# Patient Record
Sex: Female | Born: 1975 | Race: Black or African American | Hispanic: No | Marital: Married | Smoking: Never smoker
Health system: Southern US, Community
[De-identification: ages and names within clinical notes are randomized; demographics above are authoritative.]

## PROBLEM LIST (undated history)

## (undated) DIAGNOSIS — Z789 Other specified health status: Secondary | ICD-10-CM

---

## 2002-04-12 HISTORY — PX: LAPAROSCOPY ABDOMEN DIAGNOSTIC: PRO50

## 2013-04-12 NOTE — L&D Delivery Note (Signed)
Delivery Note At 8:14 PM a viable and healthy female was delivered via Vaginal, Spontaneous Delivery (Presentation: Left Occiput Anterior).  APGAR: 9, 9; weight 7 lb 14.6 oz (3590 g).   Placenta status: Intact, Spontaneous.  Cord: 3 vessels with the following complications: None.    Anesthesia: Epidural  Episiotomy: None Lacerations: 2nd degree Suture Repair: 3.0 vicryl rapide Est. Blood Loss (mL): 350  Mom to postpartum.  Baby to Couplet care / Skin to Skin.  [facility] DtMUHAMMAD,WALIDAH 10/22/2013, 7:27 AM

## 2013-07-26 LAB — OB RESULTS CONSOLE HIV ANTIBODY (ROUTINE TESTING)
HIV: NONREACTIVE
HIV: NONREACTIVE

## 2013-07-26 LAB — OB RESULTS CONSOLE GC/CHLAMYDIA
Chlamydia: NEGATIVE
Chlamydia: NEGATIVE
GC PROBE AMP, GENITAL: NEGATIVE
Gonorrhea: NEGATIVE

## 2013-07-26 LAB — OB RESULTS CONSOLE HEPATITIS B SURFACE ANTIGEN
Hepatitis B Surface Ag: NEGATIVE
Hepatitis B Surface Ag: NEGATIVE

## 2013-07-26 LAB — OB RESULTS CONSOLE ANTIBODY SCREEN: ANTIBODY SCREEN: NEGATIVE

## 2013-07-26 LAB — OB RESULTS CONSOLE ABO/RH: RH Type: POSITIVE

## 2013-07-26 LAB — OB RESULTS CONSOLE RUBELLA ANTIBODY, IGM
RUBELLA: IMMUNE
Rubella: IMMUNE

## 2013-07-26 LAB — OB RESULTS CONSOLE RPR
RPR: NONREACTIVE
RPR: NONREACTIVE

## 2013-07-31 ENCOUNTER — Other Ambulatory Visit (HOSPITAL_COMMUNITY): Payer: Self-pay | Admitting: Nurse Practitioner

## 2013-07-31 DIAGNOSIS — Z3689 Encounter for other specified antenatal screening: Secondary | ICD-10-CM

## 2013-08-08 ENCOUNTER — Ambulatory Visit (HOSPITAL_COMMUNITY): Admission: RE | Admit: 2013-08-08 | Source: Ambulatory Visit

## 2013-08-13 ENCOUNTER — Encounter (HOSPITAL_COMMUNITY): Payer: Self-pay

## 2013-08-13 ENCOUNTER — Other Ambulatory Visit (HOSPITAL_COMMUNITY): Payer: Self-pay | Admitting: Nurse Practitioner

## 2013-08-13 ENCOUNTER — Ambulatory Visit (HOSPITAL_COMMUNITY)
Admission: RE | Admit: 2013-08-13 | Discharge: 2013-08-13 | Disposition: A | Discharge status: Home / Self Care | Source: Ambulatory Visit | Attending: Nurse Practitioner | Admitting: Nurse Practitioner

## 2013-08-13 DIAGNOSIS — Z3689 Encounter for other specified antenatal screening: Secondary | ICD-10-CM

## 2013-09-06 ENCOUNTER — Other Ambulatory Visit (HOSPITAL_COMMUNITY): Payer: Self-pay | Admitting: Nurse Practitioner

## 2013-09-06 DIAGNOSIS — Z0489 Encounter for examination and observation for other specified reasons: Secondary | ICD-10-CM

## 2013-09-06 DIAGNOSIS — IMO0002 Reserved for concepts with insufficient information to code with codable children: Secondary | ICD-10-CM

## 2013-09-13 ENCOUNTER — Ambulatory Visit (HOSPITAL_COMMUNITY)
Admission: RE | Admit: 2013-09-13 | Discharge: 2013-09-13 | Disposition: A | Discharge status: Home / Self Care | Source: Ambulatory Visit | Attending: Nurse Practitioner | Admitting: Nurse Practitioner

## 2013-09-13 DIAGNOSIS — Z1389 Encounter for screening for other disorder: Secondary | ICD-10-CM | POA: Diagnosis not present

## 2013-09-13 DIAGNOSIS — Z3689 Encounter for other specified antenatal screening: Secondary | ICD-10-CM | POA: Insufficient documentation

## 2013-09-13 DIAGNOSIS — Z0489 Encounter for examination and observation for other specified reasons: Secondary | ICD-10-CM

## 2013-09-13 DIAGNOSIS — IMO0002 Reserved for concepts with insufficient information to code with codable children: Secondary | ICD-10-CM

## 2013-09-13 DIAGNOSIS — Z363 Encounter for antenatal screening for malformations: Secondary | ICD-10-CM | POA: Insufficient documentation

## 2013-09-27 LAB — OB RESULTS CONSOLE GBS: GBS: NEGATIVE

## 2013-10-21 ENCOUNTER — Encounter (HOSPITAL_COMMUNITY): Payer: Self-pay | Admitting: *Deleted

## 2013-10-21 ENCOUNTER — Inpatient Hospital Stay (HOSPITAL_COMMUNITY): Admitting: Anesthesiology

## 2013-10-21 ENCOUNTER — Encounter (HOSPITAL_COMMUNITY): Payer: Self-pay | Admitting: General Practice

## 2013-10-21 ENCOUNTER — Inpatient Hospital Stay (HOSPITAL_COMMUNITY)
Admission: AD | Admit: 2013-10-21 | Discharge: 2013-10-21 | Disposition: A | Discharge status: Home / Self Care | Source: Ambulatory Visit | Attending: Obstetrics & Gynecology | Admitting: Obstetrics & Gynecology

## 2013-10-21 ENCOUNTER — Inpatient Hospital Stay (HOSPITAL_COMMUNITY)
Admission: AD | Admit: 2013-10-21 | Discharge: 2013-10-23 | DRG: 775 | Disposition: A | Discharge status: Home / Self Care | Source: Ambulatory Visit | Attending: Obstetrics & Gynecology | Admitting: Obstetrics & Gynecology

## 2013-10-21 ENCOUNTER — Encounter (HOSPITAL_COMMUNITY): Admitting: Anesthesiology

## 2013-10-21 DIAGNOSIS — O479 False labor, unspecified: Secondary | ICD-10-CM | POA: Diagnosis present

## 2013-10-21 DIAGNOSIS — IMO0001 Reserved for inherently not codable concepts without codable children: Secondary | ICD-10-CM

## 2013-10-21 LAB — CBC
HCT: 38.6 % (ref 36.0–46.0)
Hemoglobin: 13.5 g/dL (ref 12.0–15.0)
MCH: 32.8 pg (ref 26.0–34.0)
MCHC: 35 g/dL (ref 30.0–36.0)
MCV: 93.9 fL (ref 78.0–100.0)
Platelets: 177 10*3/uL (ref 150–400)
RBC: 4.11 MIL/uL (ref 3.87–5.11)
RDW: 13.2 % (ref 11.5–15.5)
WBC: 8.5 10*3/uL (ref 4.0–10.5)

## 2013-10-21 LAB — TYPE AND SCREEN
ABO/RH(D): A POS
ANTIBODY SCREEN: NEGATIVE

## 2013-10-21 LAB — ABO/RH: ABO/RH(D): A POS

## 2013-10-21 MED ORDER — CITRIC ACID-SODIUM CITRATE 334-500 MG/5ML PO SOLN: 30.0000 mL | ORAL | Status: DC | PRN

## 2013-10-21 MED ORDER — IBUPROFEN 600 MG PO TABS: 600.0000 mg | ORAL_TABLET | Freq: Four times a day (QID) | ORAL | Status: DC | PRN

## 2013-10-21 MED ORDER — PRENATAL MULTIVITAMIN CH
1.0000 | ORAL_TABLET | Freq: Every day | ORAL | Status: DC
  Administered 2013-10-22 – 2013-10-23 (×2): 1 via ORAL
  Filled 2013-10-21 (×2): qty 1

## 2013-10-21 MED ORDER — EPHEDRINE 5 MG/ML INJ
10.0000 mg | INTRAVENOUS | Status: DC | PRN
  Filled 2013-10-21: qty 2

## 2013-10-21 MED ORDER — LANOLIN HYDROUS EX OINT: TOPICAL_OINTMENT | CUTANEOUS | Status: DC | PRN

## 2013-10-21 MED ORDER — ZOLPIDEM TARTRATE 5 MG PO TABS: 5.0000 mg | ORAL_TABLET | Freq: Every evening | ORAL | Status: DC | PRN

## 2013-10-21 MED ORDER — LACTATED RINGERS IV SOLN
500.0000 mL | Freq: Once | INTRAVENOUS | Status: AC
  Administered 2013-10-21: 18:00:00 via INTRAVENOUS

## 2013-10-21 MED ORDER — FENTANYL 2.5 MCG/ML BUPIVACAINE 1/10 % EPIDURAL INFUSION (WH - ANES)
14.0000 mL/h | INTRAMUSCULAR | Status: DC | PRN
  Administered 2013-10-21: 14 mL/h via EPIDURAL
  Filled 2013-10-21: qty 125

## 2013-10-21 MED ORDER — LACTATED RINGERS IV SOLN: INTRAVENOUS | Status: DC

## 2013-10-21 MED ORDER — DIPHENHYDRAMINE HCL 25 MG PO CAPS: 25.0000 mg | ORAL_CAPSULE | Freq: Four times a day (QID) | ORAL | Status: DC | PRN

## 2013-10-21 MED ORDER — WITCH HAZEL-GLYCERIN EX PADS: 1.0000 "application " | MEDICATED_PAD | CUTANEOUS | Status: DC | PRN

## 2013-10-21 MED ORDER — FLEET ENEMA 7-19 GM/118ML RE ENEM: 1.0000 | ENEMA | RECTAL | Status: DC | PRN

## 2013-10-21 MED ORDER — ONDANSETRON HCL 4 MG/2ML IJ SOLN: 4.0000 mg | Freq: Four times a day (QID) | INTRAMUSCULAR | Status: DC | PRN

## 2013-10-21 MED ORDER — ACETAMINOPHEN 325 MG PO TABS: 650.0000 mg | ORAL_TABLET | ORAL | Status: DC | PRN

## 2013-10-21 MED ORDER — OXYTOCIN 40 UNITS IN LACTATED RINGERS INFUSION - SIMPLE MED
62.5000 mL/h | INTRAVENOUS | Status: DC
  Filled 2013-10-21: qty 1000

## 2013-10-21 MED ORDER — ONDANSETRON HCL 4 MG/2ML IJ SOLN: 4.0000 mg | INTRAMUSCULAR | Status: DC | PRN

## 2013-10-21 MED ORDER — OXYCODONE-ACETAMINOPHEN 5-325 MG PO TABS
1.0000 | ORAL_TABLET | ORAL | Status: DC | PRN
  Administered 2013-10-22: 1 via ORAL
  Filled 2013-10-21: qty 1

## 2013-10-21 MED ORDER — OXYTOCIN BOLUS FROM INFUSION
500.0000 mL | INTRAVENOUS | Status: DC
  Administered 2013-10-21: 500 mL via INTRAVENOUS

## 2013-10-21 MED ORDER — FENTANYL 2.5 MCG/ML BUPIVACAINE 1/10 % EPIDURAL INFUSION (WH - ANES)
14.0000 mL/h | INTRAMUSCULAR | Status: DC | PRN
Start: 2013-10-21 — End: 2013-10-21

## 2013-10-21 MED ORDER — DIPHENHYDRAMINE HCL 50 MG/ML IJ SOLN: 12.5000 mg | INTRAMUSCULAR | Status: DC | PRN

## 2013-10-21 MED ORDER — PHENYLEPHRINE 40 MCG/ML (10ML) SYRINGE FOR IV PUSH (FOR BLOOD PRESSURE SUPPORT)
80.0000 ug | PREFILLED_SYRINGE | INTRAVENOUS | Status: DC | PRN
  Filled 2013-10-21: qty 10
  Filled 2013-10-21: qty 2

## 2013-10-21 MED ORDER — SENNOSIDES-DOCUSATE SODIUM 8.6-50 MG PO TABS
2.0000 | ORAL_TABLET | ORAL | Status: DC
  Administered 2013-10-22 (×2): 2 via ORAL
  Filled 2013-10-21 (×2): qty 2

## 2013-10-21 MED ORDER — LACTATED RINGERS IV SOLN: 500.0000 mL | INTRAVENOUS | Status: DC | PRN

## 2013-10-21 MED ORDER — ONDANSETRON HCL 4 MG PO TABS: 4.0000 mg | ORAL_TABLET | ORAL | Status: DC | PRN

## 2013-10-21 MED ORDER — BENZOCAINE-MENTHOL 20-0.5 % EX AERO
1.0000 "application " | INHALATION_SPRAY | CUTANEOUS | Status: DC | PRN
  Administered 2013-10-22: 1 via TOPICAL
  Filled 2013-10-21: qty 56

## 2013-10-21 MED ORDER — LIDOCAINE HCL (PF) 1 % IJ SOLN
30.0000 mL | INTRAMUSCULAR | Status: DC | PRN
  Filled 2013-10-21: qty 30

## 2013-10-21 MED ORDER — OXYCODONE-ACETAMINOPHEN 5-325 MG PO TABS: 1.0000 | ORAL_TABLET | ORAL | Status: DC | PRN

## 2013-10-21 MED ORDER — LIDOCAINE HCL (PF) 1 % IJ SOLN
INTRAMUSCULAR | Status: DC | PRN
  Administered 2013-10-21: 10 mL

## 2013-10-21 MED ORDER — TETANUS-DIPHTH-ACELL PERTUSSIS 5-2.5-18.5 LF-MCG/0.5 IM SUSP: 0.5000 mL | Freq: Once | INTRAMUSCULAR | Status: DC

## 2013-10-21 MED ORDER — PHENYLEPHRINE 40 MCG/ML (10ML) SYRINGE FOR IV PUSH (FOR BLOOD PRESSURE SUPPORT)
80.0000 ug | PREFILLED_SYRINGE | INTRAVENOUS | Status: DC | PRN
  Filled 2013-10-21: qty 2

## 2013-10-21 MED ORDER — SIMETHICONE 80 MG PO CHEW: 80.0000 mg | CHEWABLE_TABLET | ORAL | Status: DC | PRN

## 2013-10-21 MED ORDER — IBUPROFEN 600 MG PO TABS
600.0000 mg | ORAL_TABLET | Freq: Four times a day (QID) | ORAL | Status: DC
  Administered 2013-10-22 – 2013-10-23 (×7): 600 mg via ORAL
  Filled 2013-10-21 (×7): qty 1

## 2013-10-21 MED ORDER — DIBUCAINE 1 % RE OINT: 1.0000 "application " | TOPICAL_OINTMENT | RECTAL | Status: DC | PRN

## 2013-10-21 NOTE — Anesthesia Procedure Notes (Signed)

## 2013-10-21 NOTE — MAU Note (Addendum)
irreg contractions last few days.  Since  Last night have been  Every 10-5015min. No bleeeding, ? Leaking x2 this morning, 1 cm 3 wks ago, last wk ?

## 2013-10-21 NOTE — Discharge Instructions (Signed)
Braxton Hicks Contractions °Contractions of the uterus can occur throughout pregnancy. Contractions are not always a sign that you are in labor.  °WHAT ARE BRAXTON HICKS CONTRACTIONS?  °Contractions that occur before labor are called Braxton Hicks contractions, or false labor. Toward the end of pregnancy (32-34 weeks), these contractions can develop more often and may become more forceful. This is not true labor because these contractions do not result in opening (dilatation) and thinning of the cervix. They are sometimes difficult to tell apart from true labor because these contractions can be forceful and people have different pain tolerances. You should not feel embarrassed if you go to the hospital with false labor. Sometimes, the only way to tell if you are in true labor is for your health care provider to look for changes in the cervix. °If there are no prenatal problems or other health problems associated with the pregnancy, it is completely safe to be sent home with false labor and await the onset of true labor. °HOW CAN YOU TELL THE DIFFERENCE BETWEEN TRUE AND FALSE LABOR? °False Labor °· The contractions of false labor are usually shorter and not as hard as those of true labor.   °· The contractions are usually irregular.   °· The contractions are often felt in the front of the lower abdomen and in the groin.   °· The contractions may go away when you walk around or change positions while lying down.   °· The contractions get weaker and are shorter lasting as time goes on.   °· The contractions do not usually become progressively stronger, regular, and closer together as with true labor.   °True Labor °· Contractions in true labor last 30-70 seconds, become very regular, usually become more intense, and increase in frequency.   °· The contractions do not go away with walking.   °· The discomfort is usually felt in the top of the uterus and spreads to the lower abdomen and low back.   °· True labor can be  determined by your health care provider with an exam. This will show that the cervix is dilating and getting thinner.   °WHAT TO REMEMBER °· Keep up with your usual exercises and follow other instructions given by your health care provider.   °· Take medicines as directed by your health care provider.   °· Keep your regular prenatal appointments.   °· Eat and drink lightly if you think you are going into labor.   °· If Braxton Hicks contractions are making you uncomfortable:   °¨ Change your position from lying down or resting to walking, or from walking to resting.   °¨ Sit and rest in a tub of warm water.   °¨ Drink 2-3 glasses of water. Dehydration may cause these contractions.   °¨ Do slow and deep breathing several times an hour.   °WHEN SHOULD I SEEK IMMEDIATE MEDICAL CARE? °Seek immediate medical care if: °· Your contractions become stronger, more regular, and closer together.   °· You have fluid leaking or gushing from your vagina.   °· You have a fever.   °· You pass blood-tinged mucus.   °· You have vaginal bleeding.   °· You have continuous abdominal pain.   °· You have low back pain that you never had before.   °· You feel your baby's head pushing down and causing pelvic pressure.   °· Your baby is not moving as much as it used to.   °Document Released: 03/29/2005 Document Revised: 04/03/2013 Document Reviewed: 01/08/2013 °Exit[employer]® Patient Information ©2015 [employer]. This information is not intended to replace advice given to you by your health care   provider. Make sure you discuss any questions you have with your health care provider. ° °

## 2013-10-21 NOTE — MAU Note (Signed)
Pain increasing- brought to rm

## 2013-10-21 NOTE — Progress Notes (Signed)
Patient ID: Briana Alexander, female   DOB: 04/30/1975, 38 y.o.   MRN: [id number] Briana Alexander is a 38 y.o. G3P2002 at 1637w1d admitted for SOOL  Subjective: Comfortable after epidural  Objective: BP 101/55  Pulse 93  Temp(Src) 98 F (36.7 C) (Axillary)  Resp 18  SpO2 99%  Fetal Heart FHR: 140 bpm, variability: moderate,  accelerations:  Present,  decelerations:  Absent   Contractions: q 6min  SVE:   Dilation: 6 Effacement (%): 100 Station: -2 Exam by:: cheryl motte, rn SVE by me: 6-7/100/-1 AROM clear AF  Assessment / Plan:  Labor: Active phase Fetal Wellbeing: Category 1 Pain Control:  Adequate with epidural Expected mode of delivery: NSVD  Jashad Depaula 10/21/2013, 7:40 PM

## 2013-10-21 NOTE — Anesthesia Preprocedure Evaluation (Signed)

## 2013-10-21 NOTE — Progress Notes (Signed)
Spoke with Dr. Michail JewelsMarsh about pt possibly going for a BTL in am because epidural was still in. Dr Michail JewelsMarsh states pt does not have to be NPO because she will not be getting a BTL in a.m and to have epidural taking out.

## 2013-10-22 LAB — RPR

## 2013-10-22 NOTE — Progress Notes (Cosign Needed)
Post Partum Day 1 Subjective: no complaints, up ad lib, voiding and tolerating PO In good spirits, feeling well  Objective: Blood pressure 98/58, pulse 74, temperature 98.2 F (36.8 C), temperature source Oral, resp. rate 18, SpO2 100.00%, unknown if currently breastfeeding.  Physical Exam:  General: alert and cooperative Lochia: appropriate Uterine Fundus: firm Incision: n/a DVT Evaluation: No evidence of DVT seen on physical exam. No cords or calf tenderness. No significant calf/ankle edema.   Recent Labs  10/21/13 1755  HGB 13.5  HCT 38.6    Assessment/Plan: Plan for discharge tomorrow, Breastfeeding, Circumcision prior to discharge and Contraception desires BTL (papers only signed 1 week ago) will plan for outpt procedure   LOS: 1 day   Anselm LisMarsh, Niyonna Betsill 10/22/2013, 7:08 AM

## 2013-10-22 NOTE — Anesthesia Postprocedure Evaluation (Signed)
    Last Vitals:  Filed Vitals:   10/22/13 0530  BP: 98/58  Pulse: 74  Temp: 36.8 C  Resp: 18   Anesthesia Post Note  Patient: [name]  Procedure(s) Performed: * No procedures listed *  Anesthesia type: Epidural  Patient location: Mother/Baby  Post pain: Pain level controlled  Post assessment: Post-op Vital signs reviewed  Last Vitals:  Filed Vitals:   10/22/13 0530  BP: 98/58  Pulse: 74  Temp: 36.8 C  Resp: 18    Post vital signs: Reviewed  Level of consciousness:alert  Complications: No apparent anesthesia complications

## 2013-10-22 NOTE — Lactation Note (Signed)
This note was copied from the chart of Briana Rosalie Gumsatalie Wojcicki. Lactation Consultation Note  Ex BF, Baby sleeping. Mother's right nipple large "door knob" shape, left nipple smaller, inverts when compressed. Mother states she is having a difficult time latching infant on the left nipple.  Has had history of engorgement on left side due to baby not latching and emptying breast. Provided mother with hand pump, shells.  Suggest mother prepump with hand pump and use reverse pressure to elongate nipple.  After breastfeeding suggest she wear shells. Also if baby is unable to latch mother can use hand pump to stimulate milk supply on left. Encouraged mother to call if so she can get help latching infant on left.   Patient Name: Briana Alexander ZOXWR'UToday's Date: 10/22/2013 Reason for consult: Follow-up assessment   Maternal Data Has patient been taught Hand Expression?: Yes Does the patient have breastfeeding experience prior to this delivery?: Yes  Feeding    LATCH Score/Interventions                      Lactation Tools Discussed/Used     Consult Status Consult Status: Follow-up Date: 10/23/13 Follow-up type: In-patient    Briana Alexander, [name] [facility] 10/22/2013, 5:15 PM

## 2013-10-22 NOTE — Lactation Note (Signed)
This note was copied from the chart of Briana Rosalie Gumsatalie Christopher. Lactation Consultation Note Mom BF her 4 yr. Old for 13 months w/o difficulties, and BF her 81month old for only 4 months. Mom had to go back to work and the baby quit taking her breast. Plans on staying at home with this baby and plans on BF for over a yr. Has good anatomy. Lt. Nipple not as long as Rt. And baby favors Rt. Nipple. Both are everted. Nipples are asymetrical. Mom encouraged to feed baby 8-12 times/24 hours and with feeding cues. Mom encouraged to feed baby w/feeding cuesMom encouraged to do skin-to-skin.WH/LC brochure given w/resources, support groups and LC services.Encouraged to call for assistance if needed and to verify proper latch. Patient Name: Briana Rosalie Gumsatalie Schult OZDGU'YToday's Date: 10/22/2013     Maternal Data    Feeding    LATCH Score/Interventions                      Lactation Tools Discussed/Used     Consult Status      Charyl DancerCARVER, [name] 10/22/2013, 3:41 AM

## 2013-10-23 MED ORDER — IBUPROFEN 600 MG PO TABS: 600.0000 mg | ORAL_TABLET | Freq: Four times a day (QID) | ORAL | Status: DC

## 2013-10-23 NOTE — Lactation Note (Signed)
This note was copied from the chart of Briana Alexander. Lactation Consultation Note  Patient Name: Briana Alexander ZOXWR'UToday's Date: 10/23/2013 Reason for consult: Follow-up assessment Baby is 37 hours old and per mom just recently fed for 15 mins. ( LC updated doc flow sheets per mom )  Baby has been consistent with feedings and per mom breast are filling.  Per mom due to the left nipple being shorter with her other baby's she has had challenges when her breast are full .  With moms permission assessed breast tissue and nipples. LC noted the left nipple does has a shorter shaft, but the areola  Is compressible. Breast are filling bilaterally, not engorged. LC recommended prior to latching on the left breast - breast massage , hand express, Pre - pump to make the nipple and areola more elastic, and then reverse pressure, latch with firm support and breast compressions.  Also gave mom a large flange for her pump for the right nipple ( due to increased size ) reviewed sore nipple and engorgement prevention and tx if needed. Mother informed of post-discharge support and given phone number to the lactation department, including services for phone call assistance; out-patient appointments; and breastfeeding support group. List of other breastfeeding resources in the community given in the handout. Encouraged mother to call for problems or concerns related to breastfeeding.   Maternal Data Has patient been taught Hand Expression?: Yes  Feeding Feeding Type:  (per mom recently fed for 15 mins ) Length of feed: 15 min (per mom )  LATCH Score/Interventions                      Lactation Tools Discussed/Used Tools: Shells;Pump Shell Type: Inverted Breast pump type: Manual Pump Review: Milk Storage (mom already has a hand pump and per mom pump at home DEBP ) Initiated by:: MAI  Date initiated:: 10/23/13   Consult Status Consult Status: Complete Date: 10/23/13    Kathrin Greathouseorio,  Melannie Metzner Ann 10/23/2013, 9:31 AM

## 2013-10-23 NOTE — Discharge Summary (Signed)
Obstetric Discharge Summary Reason for Admission: onset of labor Prenatal Procedures: NST Intrapartum Procedures: spontaneous vaginal delivery Postpartum Procedures: none Complications-Operative and Postpartum: 2nd degree perineal laceration Hemoglobin  Date Value Ref Range Status  10/21/2013 13.5  12.0 - 15.0 g/dL Final     HCT  Date Value Ref Range Status  10/21/2013 38.6  36.0 - 46.0 % Final  Hospital Course: Admitted for labor  Delivery Note  At 8:14 PM a viable and healthy female was delivered via Vaginal, Spontaneous Delivery (Presentation: Left Occiput Anterior). APGAR: 9, 9; weight 7 lb 14.6 oz (3590 g).  Placenta status: Intact, Spontaneous. Cord: 3 vessels with the following complications: None.  Anesthesia: Epidural  Episiotomy: None  Lacerations: 2nd degree  Suture Repair: 3.0 vicryl rapide  Est. Blood Loss (mL): 350  Mom to postpartum. Baby to Couplet care / Skin to Skin.  [facility]UH[facility],WALIDAH  10/22/2013, 7:27 AM  Has done well postpartum and is ready for discharge.  Will do interval BTL.   Physical Exam:  General: alert, cooperative and no distress Lochia: appropriate Uterine Fundus: firm Incision: healing well DVT Evaluation: No evidence of DVT seen on physical exam.  Discharge Diagnoses: Term Pregnancy-delivered  Discharge Information: Date: 10/23/2013 Activity: unrestricted and pelvic rest Diet: routine Medications: PNV and Ibuprofen Condition: stable and improved Instructions: refer to practice specific booklet Discharge to: home Follow-up Information   [facility] Schedule an appointment as soon as possible for a visit in 4 weeks.   Contact information:   [address] [place] [address] [phone]      Newborn Data: Live born female  Birth Weight: 7 lb 14.6 oz (3590 g) APGAR: 9, 9  Home with mother.  [facility] LtdWILLIAMS,MARIE 10/23/2013, 5:42 AM

## 2013-11-16 NOTE — H&P (Signed)
Briana Alexander is a 38 y.o. female G3P2002 at 4357w6d presenting for SOL   Maternal Medical History:  Reason for admission: Nausea.    OB History as of 11/05/13   Grav Para Term Preterm Abortions TAB SAB Ect Mult Living   3 3 3       3      History reviewed. No pertinent past medical history. Past Surgical History  Procedure Laterality Date  . Laparoscopy abdomen diagnostic  2004   Family History: family history is not on file. Social History:  reports that she has never smoked. She has never used smokeless tobacco. She reports that she does not drink alcohol or use illicit drugs.   Prenatal Transfer Tool  Maternal Diabetes: No Genetic Screening: Normal Maternal Ultrasounds/Referrals: Normal Fetal Ultrasounds or other Referrals:  None Maternal Substance Abuse:  No Significant Maternal Medications:  None Significant Maternal Lab Results:  Lab values include: Group B Strep negative Other Comments:  None  Review of Systems  Constitutional: Negative.  Negative for fever.  Respiratory: Negative for cough.   Cardiovascular: Negative for chest pain.  Gastrointestinal: Positive for abdominal pain. Negative for nausea and vomiting.  Genitourinary: Negative for dysuria.  Neurological: Negative for headaches.    Dilation: 10 Effacement (%): 100 Station: +2 Exam by:: Briana Alexander  Blood pressure 95/61, pulse 89, temperature 97.9 F (36.6 C), temperature source Oral, resp. rate 16, SpO2 100.00%, unknown if currently breastfeeding. Maternal Exam:  Uterine Assessment: Contraction strength is moderate.  Contraction frequency is regular.   Abdomen: Estimated fetal weight is 7#.   Fetal presentation: vertex  Introitus: Normal vulva. Ferning test: not done.  Nitrazine test: not done.  Pelvis: adequate for delivery.   Cervix: Cervix evaluated by digital exam.     Fetal Exam Fetal Monitor Review: Mode: ultrasound.   Baseline rate: 140.  Variability: moderate (6-25 bpm).    Pattern: accelerations present and no decelerations.    Fetal State Assessment: Category I - tracings are normal.     Physical Exam  Nursing note reviewed. Constitutional: She is oriented to person, place, and time. She appears well-developed and well-nourished.  HENT:  Head: Normocephalic.  Neck: Normal range of motion.  Cardiovascular: Normal rate.   Respiratory: Effort normal.  GI: Soft. There is no tenderness.  Musculoskeletal: Normal range of motion.  Neurological: She is oriented to person, place, and time.  Skin: Skin is warm and dry.  Psychiatric: Her behavior is normal. Judgment and thought content normal.    Prenatal labs: ABO, Rh: --/--/A POS, A POS (07/12 1755) Antibody: NEG (07/12 1755) Rubella: Immune, Immune (04/16 0000) RPR: NON REAC (07/12 1755)  HBsAg: Negative, Negative (04/16 0000)  HIV: Non-reactive, Non-reactive (04/16 0000)  GBS: Negative (06/18 0000)  1 hr glucola 75  Assessment/Plan: Active labor at term> admit for expectant management   Briana Alexander 11/16/2013, 1:23 PM Late entry. Resident neglected to do this H&P on 10/21/13

## 2013-11-17 NOTE — H&P (Signed)
`````  Attestation of Attending Supervision of Advanced Practitioner: Evaluation and management procedures were performed by the PA/NP/CNM/OB Fellow under my supervision/collaboration. Chart reviewed and agree with management and plan.  Tehani Mersman V 11/17/2013 10:09 PM

## 2013-11-21 ENCOUNTER — Encounter: Payer: Self-pay | Admitting: *Deleted

## 2013-12-19 ENCOUNTER — Encounter (HOSPITAL_COMMUNITY): Payer: Self-pay | Admitting: *Deleted

## 2013-12-21 ENCOUNTER — Encounter (HOSPITAL_COMMUNITY): Payer: Self-pay

## 2013-12-25 ENCOUNTER — Ambulatory Visit (HOSPITAL_COMMUNITY)
Admission: RE | Admit: 2013-12-25 | Discharge: 2013-12-25 | Disposition: A | Discharge status: Home / Self Care | Source: Ambulatory Visit | Attending: Obstetrics & Gynecology | Admitting: Obstetrics & Gynecology

## 2013-12-25 ENCOUNTER — Encounter (HOSPITAL_COMMUNITY)
Admission: RE | Disposition: A | Discharge status: Home / Self Care | Payer: Self-pay | Source: Ambulatory Visit | Attending: Obstetrics & Gynecology

## 2013-12-25 ENCOUNTER — Ambulatory Visit (HOSPITAL_COMMUNITY): Admitting: Certified Registered Nurse Anesthetist

## 2013-12-25 ENCOUNTER — Encounter (HOSPITAL_COMMUNITY): Admitting: Certified Registered Nurse Anesthetist

## 2013-12-25 ENCOUNTER — Encounter (HOSPITAL_COMMUNITY): Payer: Self-pay | Admitting: Anesthesiology

## 2013-12-25 DIAGNOSIS — Z302 Encounter for sterilization: Secondary | ICD-10-CM | POA: Diagnosis present

## 2013-12-25 DIAGNOSIS — Z9079 Acquired absence of other genital organ(s): Secondary | ICD-10-CM

## 2013-12-25 HISTORY — DX: Other specified health status: Z78.9

## 2013-12-25 HISTORY — PX: LAPAROSCOPIC BILATERAL SALPINGECTOMY: SHX5889

## 2013-12-25 LAB — CBC
HEMATOCRIT: 39.2 % (ref 36.0–46.0)
Hemoglobin: 13.5 g/dL (ref 12.0–15.0)
MCH: 31 pg (ref 26.0–34.0)
MCHC: 34.4 g/dL (ref 30.0–36.0)
MCV: 90.1 fL (ref 78.0–100.0)
PLATELETS: 228 10*3/uL (ref 150–400)
RBC: 4.35 MIL/uL (ref 3.87–5.11)
RDW: 12.3 % (ref 11.5–15.5)
WBC: 5.2 10*3/uL (ref 4.0–10.5)

## 2013-12-25 LAB — PREGNANCY, URINE: Preg Test, Ur: NEGATIVE

## 2013-12-25 SURGERY — SALPINGECTOMY, BILATERAL, LAPAROSCOPIC
Anesthesia: General | Site: Abdomen | Laterality: Bilateral

## 2013-12-25 MED ORDER — ONDANSETRON HCL 4 MG/2ML IJ SOLN
INTRAMUSCULAR | Status: DC | PRN
  Administered 2013-12-25: 4 mg via INTRAVENOUS

## 2013-12-25 MED ORDER — METOCLOPRAMIDE HCL 5 MG/ML IJ SOLN: 10.0000 mg | Freq: Once | INTRAMUSCULAR | Status: DC | PRN

## 2013-12-25 MED ORDER — KETOROLAC TROMETHAMINE 30 MG/ML IJ SOLN
INTRAMUSCULAR | Status: DC | PRN
  Administered 2013-12-25: 30 mg via INTRAVENOUS

## 2013-12-25 MED ORDER — FENTANYL CITRATE 0.05 MG/ML IJ SOLN
INTRAMUSCULAR | Status: AC
  Administered 2013-12-25: 50 ug via INTRAVENOUS
  Filled 2013-12-25: qty 2

## 2013-12-25 MED ORDER — LIDOCAINE HCL (CARDIAC) 20 MG/ML IV SOLN
INTRAVENOUS | Status: DC | PRN
  Administered 2013-12-25: 70 mg via INTRAVENOUS
  Administered 2013-12-25: 30 mg via INTRAVENOUS

## 2013-12-25 MED ORDER — LIDOCAINE HCL (CARDIAC) 20 MG/ML IV SOLN
INTRAVENOUS | Status: AC
  Filled 2013-12-25: qty 5

## 2013-12-25 MED ORDER — MIDAZOLAM HCL 2 MG/2ML IJ SOLN
INTRAMUSCULAR | Status: AC
  Filled 2013-12-25: qty 2

## 2013-12-25 MED ORDER — DEXAMETHASONE SODIUM PHOSPHATE 10 MG/ML IJ SOLN
INTRAMUSCULAR | Status: AC
  Filled 2013-12-25: qty 1

## 2013-12-25 MED ORDER — BUPIVACAINE HCL (PF) 0.25 % IJ SOLN
INTRAMUSCULAR | Status: DC | PRN
  Administered 2013-12-25: 10 mL

## 2013-12-25 MED ORDER — FENTANYL CITRATE 0.05 MG/ML IJ SOLN
INTRAMUSCULAR | Status: AC
  Filled 2013-12-25: qty 2

## 2013-12-25 MED ORDER — KETOROLAC TROMETHAMINE 30 MG/ML IJ SOLN
INTRAMUSCULAR | Status: AC
  Filled 2013-12-25: qty 1

## 2013-12-25 MED ORDER — OXYCODONE-ACETAMINOPHEN 5-325 MG PO TABS: 1.0000 | ORAL_TABLET | Freq: Four times a day (QID) | ORAL | Status: AC | PRN

## 2013-12-25 MED ORDER — LACTATED RINGERS IV SOLN
INTRAVENOUS | Status: DC
  Administered 2013-12-25 (×2): via INTRAVENOUS

## 2013-12-25 MED ORDER — PROPOFOL 10 MG/ML IV BOLUS
INTRAVENOUS | Status: DC | PRN
Start: 2013-12-25 — End: 2013-12-25
  Administered 2013-12-25: 150 mg via INTRAVENOUS

## 2013-12-25 MED ORDER — MIDAZOLAM HCL 2 MG/2ML IJ SOLN
INTRAMUSCULAR | Status: DC | PRN
  Administered 2013-12-25: 0.5 mg via INTRAVENOUS

## 2013-12-25 MED ORDER — MEPERIDINE HCL 25 MG/ML IJ SOLN: 6.2500 mg | INTRAMUSCULAR | Status: DC | PRN

## 2013-12-25 MED ORDER — FENTANYL CITRATE 0.05 MG/ML IJ SOLN
25.0000 ug | INTRAMUSCULAR | Status: DC | PRN
  Administered 2013-12-25 (×2): 50 ug via INTRAVENOUS

## 2013-12-25 MED ORDER — ROCURONIUM BROMIDE 100 MG/10ML IV SOLN
INTRAVENOUS | Status: DC | PRN
  Administered 2013-12-25: 20 mg via INTRAVENOUS

## 2013-12-25 MED ORDER — ROCURONIUM BROMIDE 100 MG/10ML IV SOLN
INTRAVENOUS | Status: AC
  Filled 2013-12-25: qty 1

## 2013-12-25 MED ORDER — PHENYLEPHRINE HCL 10 MG/ML IJ SOLN
INTRAMUSCULAR | Status: DC | PRN
  Administered 2013-12-25: 40 ug via INTRAVENOUS
  Administered 2013-12-25: 80 ug via INTRAVENOUS

## 2013-12-25 MED ORDER — BUPIVACAINE HCL (PF) 0.25 % IJ SOLN
INTRAMUSCULAR | Status: AC
  Filled 2013-12-25: qty 30

## 2013-12-25 MED ORDER — SCOPOLAMINE 1 MG/3DAYS TD PT72
MEDICATED_PATCH | TRANSDERMAL | Status: AC
  Administered 2013-12-25: 1.5 mg via TRANSDERMAL
  Filled 2013-12-25: qty 1

## 2013-12-25 MED ORDER — PROPOFOL 10 MG/ML IV EMUL
INTRAVENOUS | Status: AC
  Filled 2013-12-25: qty 20

## 2013-12-25 MED ORDER — DEXAMETHASONE SODIUM PHOSPHATE 10 MG/ML IJ SOLN
INTRAMUSCULAR | Status: DC | PRN
  Administered 2013-12-25: 4 mg via INTRAVENOUS

## 2013-12-25 MED ORDER — NEOSTIGMINE METHYLSULFATE 10 MG/10ML IV SOLN
INTRAVENOUS | Status: DC | PRN
  Administered 2013-12-25: 2 mg via INTRAVENOUS

## 2013-12-25 MED ORDER — SCOPOLAMINE 1 MG/3DAYS TD PT72
1.0000 | MEDICATED_PATCH | Freq: Once | TRANSDERMAL | Status: DC
  Administered 2013-12-25: 1.5 mg via TRANSDERMAL

## 2013-12-25 MED ORDER — KETOROLAC TROMETHAMINE 30 MG/ML IJ SOLN: 15.0000 mg | Freq: Once | INTRAMUSCULAR | Status: DC | PRN

## 2013-12-25 MED ORDER — GLYCOPYRROLATE 0.2 MG/ML IJ SOLN
INTRAMUSCULAR | Status: DC | PRN
  Administered 2013-12-25: 0.4 mg via INTRAVENOUS

## 2013-12-25 MED ORDER — PHENYLEPHRINE 40 MCG/ML (10ML) SYRINGE FOR IV PUSH (FOR BLOOD PRESSURE SUPPORT)
PREFILLED_SYRINGE | INTRAVENOUS | Status: AC
  Filled 2013-12-25: qty 5

## 2013-12-25 MED ORDER — FENTANYL CITRATE 0.05 MG/ML IJ SOLN
INTRAMUSCULAR | Status: DC | PRN
  Administered 2013-12-25 (×2): 50 ug via INTRAVENOUS

## 2013-12-25 MED ORDER — IBUPROFEN 600 MG PO TABS: 600.0000 mg | ORAL_TABLET | Freq: Four times a day (QID) | ORAL | Status: AC | PRN

## 2013-12-25 MED ORDER — ONDANSETRON HCL 4 MG/2ML IJ SOLN
INTRAMUSCULAR | Status: AC
  Filled 2013-12-25: qty 2

## 2013-12-25 SURGICAL SUPPLY — 22 items
APPLICATOR COTTON TIP 6IN STRL (MISCELLANEOUS) ×3 IMPLANT
BLADE 15 SAFETY STRL DISP (BLADE) ×3 IMPLANT
BLADE SURG 11 STRL SS (BLADE) ×3 IMPLANT
CLOTH BEACON ORANGE TIMEOUT ST (SAFETY) ×3 IMPLANT
DRSG COVADERM PLUS 2X2 (GAUZE/BANDAGES/DRESSINGS) ×3 IMPLANT
DRSG OPSITE POSTOP 3X4 (GAUZE/BANDAGES/DRESSINGS) ×3 IMPLANT
DURAPREP 26ML APPLICATOR (WOUND CARE) ×3 IMPLANT
GLOVE BIO SURGEON STRL SZ7 (GLOVE) ×3 IMPLANT
GLOVE BIOGEL PI IND STRL 7.0 (GLOVE) ×1 IMPLANT
GLOVE BIOGEL PI INDICATOR 7.0 (GLOVE) ×2
GOWN STRL REUS W/TWL LRG LVL3 (GOWN DISPOSABLE) ×9 IMPLANT
NS IRRIG 1000ML POUR BTL (IV SOLUTION) ×3 IMPLANT
PACK LAPAROSCOPY BASIN (CUSTOM PROCEDURE TRAY) ×3 IMPLANT
PROTECTOR NERVE ULNAR (MISCELLANEOUS) ×3 IMPLANT
SHEARS HARMONIC ACE PLUS 36CM (ENDOMECHANICALS) ×3 IMPLANT
SUT VICRYL 0 UR6 27IN ABS (SUTURE) ×9 IMPLANT
SUT VICRYL 4-0 PS2 18IN ABS (SUTURE) ×9 IMPLANT
TOWEL OR 17X24 6PK STRL BLUE (TOWEL DISPOSABLE) ×6 IMPLANT
TRAY FOLEY CATH 14FR (SET/KITS/TRAYS/PACK) ×3 IMPLANT
TROCAR XCEL NON-BLD 11X100MML (ENDOMECHANICALS) ×3 IMPLANT
TROCAR XCEL NON-BLD 5MMX100MML (ENDOMECHANICALS) ×6 IMPLANT
WATER STERILE IRR 1000ML POUR (IV SOLUTION) ×3 IMPLANT

## 2013-12-25 NOTE — Transfer of Care (Signed)
Immediate Anesthesia Transfer of Care Note  Patient: Briana Alexander  Procedure(s) Performed: Procedure(s): LAPAROSCOPIC BILATERAL SALPINGECTOMY (Bilateral)  Patient Location: PACU  Anesthesia Type:General  Level of Consciousness: awake, alert , oriented and patient cooperative  Airway & Oxygen Therapy: Patient Spontanous Breathing and Patient connected to nasal cannula oxygen  Post-op Assessment: Report given to PACU RN and Post -op Vital signs reviewed and stable  Post vital signs: Reviewed and stable  Complications: No apparent anesthesia complications

## 2013-12-25 NOTE — Anesthesia Preprocedure Evaluation (Signed)
Anesthesia Evaluation  Patient identified by MRN, date of birth, ID band Patient awake    Reviewed: Allergy & Precautions, H&P , NPO status , Patient's Chart, lab work & pertinent test results, reviewed documented beta blocker date and time   Airway Mallampati: I TM Distance: >3 FB Neck ROM: full    Dental no notable dental hx. (+) Teeth Intact   Pulmonary neg pulmonary ROS,    Pulmonary exam normal       Cardiovascular negative cardio ROS      Neuro/Psych negative neurological ROS  negative psych ROS   GI/Hepatic negative GI ROS, Neg liver ROS,   Endo/Other  negative endocrine ROS  Renal/GU negative Renal ROS     Musculoskeletal   Abdominal Normal abdominal exam  (+)   Peds  Hematology negative hematology ROS (+)   Anesthesia Other Findings   Reproductive/Obstetrics negative OB ROS                           Anesthesia Physical Anesthesia Plan  ASA: I  Anesthesia Plan: General   Post-op Pain Management:    Induction: Intravenous  Airway Management Planned: Oral ETT  Additional Equipment:   Intra-op Plan:   Post-operative Plan: Extubation in OR  Informed Consent: I have reviewed the patients History and Physical, chart, labs and discussed the procedure including the risks, benefits and alternatives for the proposed anesthesia with the patient or authorized representative who has indicated his/her understanding and acceptance.   Dental Advisory Given  Plan Discussed with: CRNA and Surgeon  Anesthesia Plan Comments:         Anesthesia Quick Evaluation

## 2013-12-25 NOTE — Op Note (Signed)
Rosalie Gums PROCEDURE DATE: 12/25/2013   PREOPERATIVE DIAGNOSIS:  Undesired fertility  POSTOPERATIVE DIAGNOSIS:  Undesired fertility  PROCEDURE:  Laparoscopic Bilateral Salpingectomy   SURGEON: Elsie Lincoln, MD  ASSISTANT: Catalina Antigua, MD  ANESTHESIA:  General endotracheal  COMPLICATIONS:  None immediate.  ESTIMATED BLOOD LOSS:  20 ml.    IINDICATIONS: 38 y.o. [id number] with undesired fertility, desires permanent sterilization. Other reversible forms of contraception were discussed with patient; she declines all other modalities.  Risks of procedure discussed with patient including permanence of method, risk of regret, bleeding, infection, injury to surrounding organs and need for additional procedures including laparotomy.  Failure risk less than 0.5% with increased risk of ectopic gestation if pregnancy occurs was also discussed with patient.  Written informed consent was obtained.    FINDINGS:  Normal uterus, fallopian tubes, and ovaries.  TECHNIQUE:  The patient was taken to the operating room where general anesthesia was obtained without difficulty.  She was then placed in the dorsal lithotomy position and prepared and draped in sterile fashion.  After an adequate timeout was performed, a bivalved speculum was then placed in the patient's vagina, and the anterior lip of cervix grasped with the single-tooth tenaculum.  The uterine manipulator was then advanced into the uterus.  The speculum was removed from the vagina.  Attention was then turned to the patient's abdomen where a 5-mm skin incision was made in the umbilical fold.  The Optiview 5-mm trocar and sleeve were then advanced without difficulty with the laparoscope under direct visualization into the abdomen.  The abdomen was then insufflated with carbon dioxide gas.  Adequate pneumoperitoneum was obtained.  A survey of the patient's pelvis and abdomen revealed the findings above. A 10mm XL trocar was placed in the LLQ and a  5-mm trocar was placed in the RLQ under direct visualization.  The fallopian tubes were transected from the uterine attachments and the underlying mesosalpinx with the Harmonic device allowing for bilateral salpingectomy.  The fallopian tubes were then removed from the abdomen under direct visualization.  The operative site was surveyed, and it was found to be hemostatic.   No intraoperative injury to other surrounding organs was noted.  The abdomen was desufflated and all instruments were then removed from the patient's abdomen.  All skin incisions were closed with Dermabond.  The uterine manipulator was removed from the cervix without complications. The patient tolerated the procedure well.  Sponge, lap, and needle counts were correct times two.  The patient was then taken to the recovery room awake, extubated and in stable condition.  The patient will be discharged to home as per PACU criteria.  Routine postoperative instructions given.  She was prescribed Percocet, Ibuprofen and Colace.  She will follow up in the clinic in 2 weeks for postoperative evaluation.   Elsie Lincoln, MD, MHA, [facility] Medical Director Faculty Practice, [facility]

## 2013-12-25 NOTE — Discharge Instructions (Signed)
DISCHARGE INSTRUCTIONS: Laparoscopy  The following instructions have been prepared to help you care for yourself upon your return home today.  No Ibuprofen containing products (ie Advil, Aleve, Motrin, etc) until after 7:30 pm tonight.  Wound care:  Do not get the incision wet for the first 24 hours. The incision should be kept clean and dry.  The Band-Aids or dressings may be removed the day after surgery.  Should the incision become sore, red, and swollen after the first week, check with your doctor.  Personal hygiene:  Shower the day after your procedure.  Activity and limitations:  Do NOT drive or operate any equipment today.  Do NOT lift anything more than 15 pounds for 2-3 weeks after surgery.  Do NOT rest in bed all day.  Walking is encouraged. Walk each day, starting slowly with 5-minute walks 3 or 4 times a day. Slowly increase the length of your walks.  Walk up and down stairs slowly.  Do NOT do strenuous activities, such as golfing, playing tennis, bowling, running, biking, weight lifting, gardening, mowing, or vacuuming for 2-4 weeks. Ask your doctor when it is okay to start.  Diet: Eat a light meal as desired this evening. You may resume your usual diet tomorrow.  Return to work: This is dependent on the type of work you do. For the most part you can return to a desk job within a week of surgery. If you are more active at work, please discuss this with your doctor.  What to expect after your surgery: You may have a slight burning sensation when you urinate on the first day. You may have a very small amount of blood in the urine. Expect to have a small amount of vaginal discharge/light bleeding for 1-2 weeks. It is not unusual to have abdominal soreness and bruising for up to 2 weeks. You may be tired and need more rest for about 1 week. You may experience shoulder pain for 24-72 hours. Lying flat in bed may relieve it.  Call your doctor for any of the following:   Develop a fever of 100.4 or greater  Inability to urinate 6 hours after discharge from hospital  Severe pain not relieved by pain medications  Persistent of heavy bleeding at incision site  Redness or swelling around incision site after a week  Increasing nausea or vomiting  Patient Signature________________________________________ Nurse Signature_________________________________________

## 2013-12-25 NOTE — H&P (Signed)
Briana Alexander is an 38 y.o. female. With undesired fertility desiring sterilization  [id number] Diagnostic laparoscopy (non pathology found) Denies all other problems Recently given childbirth  No LMP recorded.    Past Medical History  Diagnosis Date  . Medical history non-contributory     Past Surgical History  Procedure Laterality Date  . Laparoscopy abdomen diagnostic  2004    History reviewed. No pertinent family history.  Social History:  reports that she has never smoked. She has never used smokeless tobacco. She reports that she does not drink alcohol or use illicit drugs.  Allergies: No Known Allergies  Prescriptions prior to admission  Medication Sig Dispense Refill  . Prenatal Vit-Fe Fumarate-FA (PRENATAL MULTIVITAMIN) TABS tablet Take 1 tablet by mouth daily at 12 noon.        ROS  Blood pressure 119/76, pulse 83, temperature 97.5 F (36.4 C), temperature source Oral, resp. rate 20, height  (1.651 m), weight 138 lb (62.596 kg), SpO2 98.00%, currently breastfeeding. Physical Exam  Vitals reviewed. Constitutional: She is oriented to person, place, and time. She appears well-developed and well-nourished. No distress.  HENT:  Head: Normocephalic and atraumatic.  Eyes: Conjunctivae are normal.  Cardiovascular: Normal rate and regular rhythm.   Respiratory: Effort normal and breath sounds normal.  GI: Soft. She exhibits no distension. There is no tenderness. There is no rebound and no guarding.  Genitourinary:  Exam to be done in OR  Neurological: She is alert and oriented to person, place, and time.  Skin: Skin is warm and dry.  Psychiatric: She has a normal mood and affect.    Results for orders placed during the hospital encounter of 12/25/13 (from the past 24 hour(s))  CBC     Status: None   Collection Time    12/25/13 12:13 PM      Result Value Ref Range   WBC 5.2  4.0 - 10.5 K/uL   RBC 4.35  3.87 - 5.11 MIL/uL   Hemoglobin 13.5  12.0 - 15.0 g/dL    HCT 16.1  09.6 - 04.5 %   MCV 90.1  78.0 - 100.0 fL   MCH 31.0  26.0 - 34.0 pg   MCHC 34.4  30.0 - 36.0 g/dL   RDW 40.9  81.1 - 91.4 %   Platelets 228  150 - 400 K/uL  PREGNANCY, URINE     Status: None   Collection Time    12/25/13 12:13 PM      Result Value Ref Range   Preg Test, Ur NEGATIVE  NEGATIVE    No results found.  Assessment/Plan:  38 yo G3P3 female with undesired fertility  1-discussed decreased risk of ovarian cancer with salpingectomy.  Pt would like her fallopian tubes removed. 2-Patient desires permanent sterilization.  Other reversible forms of contraception were discussed with patient; she declines all other modalities. Risks of procedure discussed with patient including but not limited to: risk of regret, permanence of method, bleeding, infection, injury to surrounding organs and need for additional procedures.  Failure risk <1% with increased risk of ectopic gestation if pregnancy occurs was also discussed with patient.    Patient verbalized understanding of these risks and wants to proceed with sterilization.  Written informed consent obtained.  To OR when ready.   Hillard Goodwine H. 12/25/2013, 12:38 PM

## 2013-12-26 ENCOUNTER — Encounter (HOSPITAL_COMMUNITY): Payer: Self-pay | Admitting: Obstetrics & Gynecology

## 2013-12-27 NOTE — Anesthesia Postprocedure Evaluation (Deleted)
Anesthesia Post Note  Patient: Briana Alexander  Procedure(s) Performed: Procedure(s) (LRB): LAPAROSCOPIC BILATERAL SALPINGECTOMY (Bilateral)  Anesthesia type: General  Patient location: PACU  Post pain: Pain level controlled  Post assessment: Post-op Vital signs reviewed  Last Vitals:  Filed Vitals:   12/25/13 1610  BP: 119/76  Pulse: 63  Temp: 36.5 C  Resp: 16    Post vital signs: Reviewed  Level of consciousness: sedated  Complications: No apparent anesthesia complications

## 2013-12-27 NOTE — Anesthesia Postprocedure Evaluation (Signed)
Anesthesia Post Note  Patient: Briana Alexander  Procedure(s) Performed: Procedure(s) (LRB): LAPAROSCOPIC BILATERAL SALPINGECTOMY (Bilateral)  Anesthesia type: General  Patient location: PACU  Post pain: Pain level controlled  Post assessment: Post-op Vital signs reviewed  Last Vitals:  Filed Vitals:   12/25/13 1610  BP: 119/76  Pulse: 63  Temp: 36.5 C  Resp: 16    Post vital signs: Reviewed  Level of consciousness: sedated  Complications: No apparent anesthesia complicationsf

## 2014-01-09 ENCOUNTER — Ambulatory Visit: Admitting: Obstetrics & Gynecology

## 2014-01-16 ENCOUNTER — Encounter: Payer: Self-pay | Admitting: Obstetrics & Gynecology

## 2014-01-16 ENCOUNTER — Ambulatory Visit (INDEPENDENT_AMBULATORY_CARE_PROVIDER_SITE_OTHER): Admitting: Obstetrics & Gynecology

## 2014-01-16 VITALS — BP 110/70 | HR 85 | Resp 16 | Ht 65.0 in | Wt 137.0 lb

## 2014-01-16 DIAGNOSIS — Z9079 Acquired absence of other genital organ(s): Secondary | ICD-10-CM

## 2014-01-16 DIAGNOSIS — Z9889 Other specified postprocedural states: Secondary | ICD-10-CM

## 2014-01-16 NOTE — Progress Notes (Signed)
Pt here for f/u from laparoscopic btl.  Pt had an uncomplicated procedure and negative pathology.  No problems post operatively.  Need last pap from health dept.  Pt reports as nml. No family history of cancer  Needs yearly exam in 1 year with mammogram.  Filed Vitals:   01/16/14 1111  BP: 110/70  Pulse: 85  Resp: 16  Height: 5\' 5"  (1.651 m)  Weight: 137 lb (62.143 kg)   Abd:  Soft, NT Incisions:  Healing well.  RTC 1 year

## 2014-02-11 ENCOUNTER — Encounter: Payer: Self-pay | Admitting: Obstetrics & Gynecology

## 2016-04-07 IMAGING — US US OB DETAIL+14 WK
1 series · 12 of 28 positions shown · non-contrast
Comparison: none

OBSTETRICS REPORT
                      (Signed Final 08/13/2013 [DATE])

Service(s) Provided
 US OB DETAIL + 14 WK                                  76811.0
Indications
 Detailed fetal anatomic survey
 Advanced maternal age (AMA), Multigravida
Fetal Evaluation
 Num Of Fetuses:    1
 Fetal Heart Rate:  139                          bpm
 Cardiac Activity:  Observed
 Presentation:      Cephalic
 Placenta:          Posterior, above cervical
                    os
 P. Cord            Visualized, central
 Insertion:
 Amniotic Fluid
 AFI FV:      Subjectively within normal limits
 AFI Sum:     11.12   cm       23  %Tile     Larg Pckt:    3.31  cm
 RUQ:   2.69    cm   RLQ:    3.13   cm    LUQ:   1.99    cm   LLQ:    3.31   cm
Biometry
 BPD:       78  mm     G. Age:  31w 2d                CI:        67.38   70 - 86
                                                      FL/HC:      20.1   19.2 -
 HC:     304.2  mm     G. Age:  33w 6d       95  %    HC/AC:      1.22   0.99 -
 AC:       249  mm     G. Age:  29w 1d       16  %    FL/BPD:     78.2   71 - 87
 FL:        61  mm     G. Age:  31w 5d       75  %    FL/AC:      24.5   20 - 24
 HUM:     54.3  mm     G. Age:  31w 4d       76  %
 Est. FW:    5948  gm      3 lb 8 oz     59  %
Gestational Age
 LMP:           28w 3d        Date:  01/26/13                 EDD:   11/02/13
 U/S Today:     31w 4d                                        EDD:   10/11/13
 Best:          30w 2d     Det. By:  [facility]         EDD:   10/20/13
Anatomy
 Cranium:          Appears normal         Aortic Arch:      Appears normal
 Fetal Cavum:      Appears normal         Ductal Arch:      Not well visualized
 Ventricles:       Appears normal         Diaphragm:        Appears normal
 Choroid Plexus:   Appears normal         Stomach:          Appears normal
 Cerebellum:       Appears normal         Abdomen:          Appears normal
 Posterior Fossa:  Appears normal         Abdominal Wall:   Appears nml (cord
                                                            insert, abd wall)
 Nuchal Fold:      Not applicable (>20    Cord Vessels:     Appears normal (3
                   wks GA)                                  vessel cord)
 Face:             Appears normal         Kidneys:          Appear normal
                   (orbits and profile)
 Lips:             Appears normal         Bladder:          Appears normal
 Heart:            Not well visualized    Spine:            Limited views
                                                            appear normal
 RVOT:             Appears normal         Lower             Appears normal
                                          Extremities:
 LVOT:             Not well visualized    Upper             Not well visualized
 Other:  Fetus appears to be a male. Heels visualized. Nasal bone visualized.
         Technically difficult due to advanced GA. Complete fetal anatomic
         survey previously performed at another facility.
Cervix Uterus Adnexa
 Cervix:       Not visualized (advanced GA >74wks)
 Uterus:       No abnormality visualized.
 Cul De Sac:   No free fluid seen.
 Left Ovary:    Within normal limits.
 Right Ovary:   Within normal limits.
 Adnexa:     No abnormality visualized.
Impression
INDICATION: 37 yr old [id number] at 90w1d for fetal anatomic
 survey. Remote read.

[Series 1: us ob detail +14 wk · 12 of 80 slices shown]
[im 3/80]
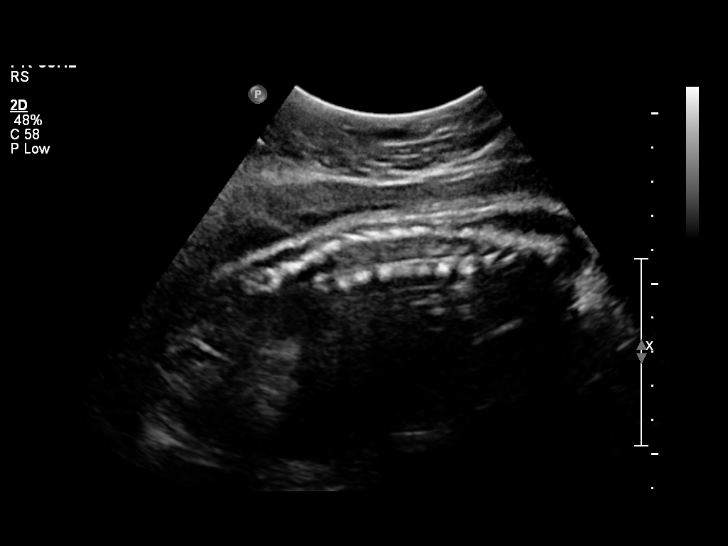
[im 9/80]
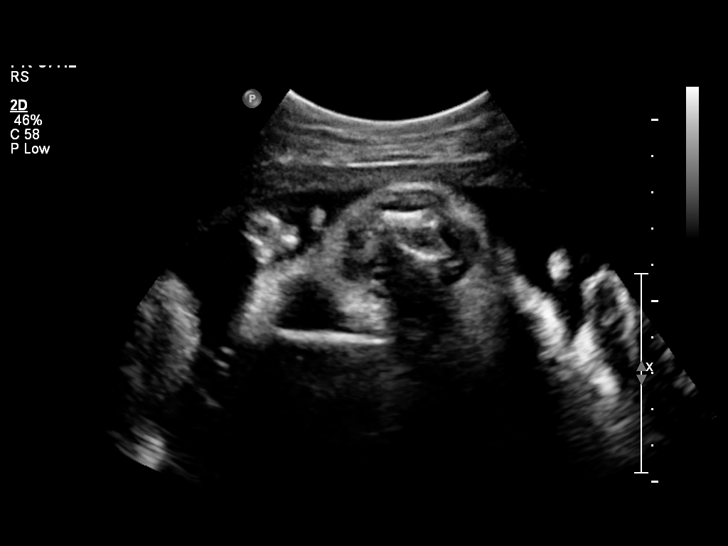
[im 15/80]
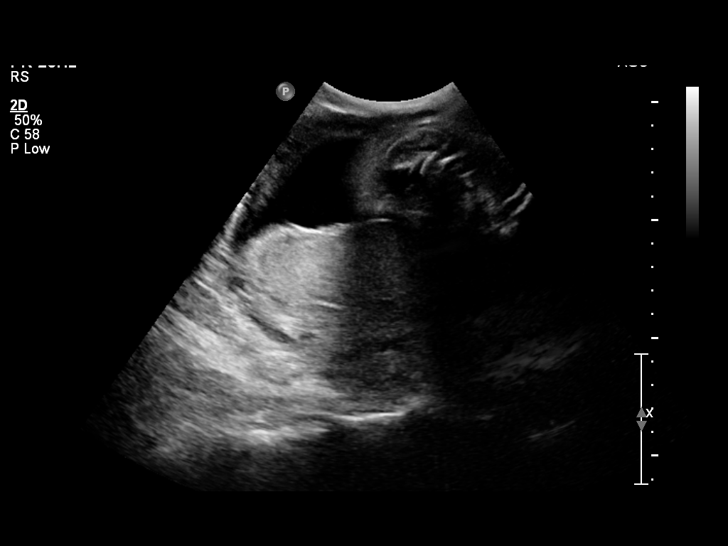
[im 24/80]
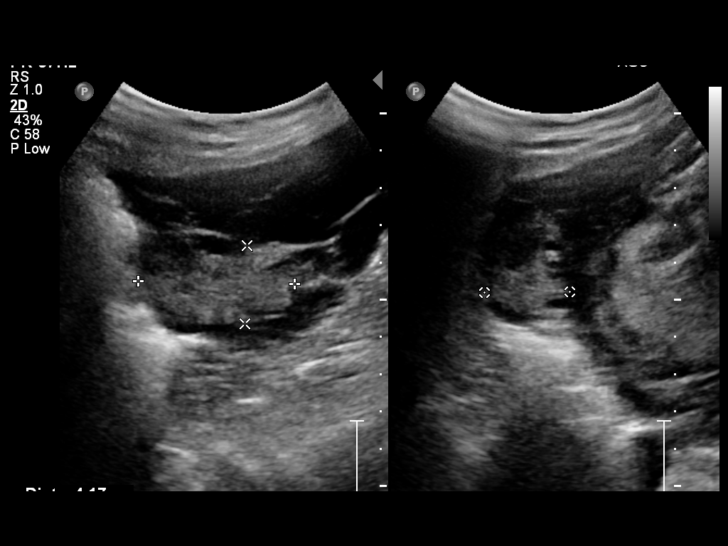
[im 30/80]
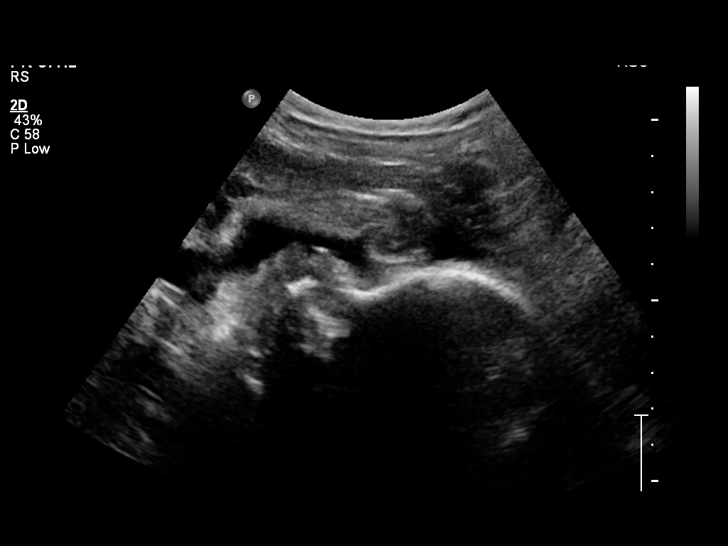
[im 36/80]
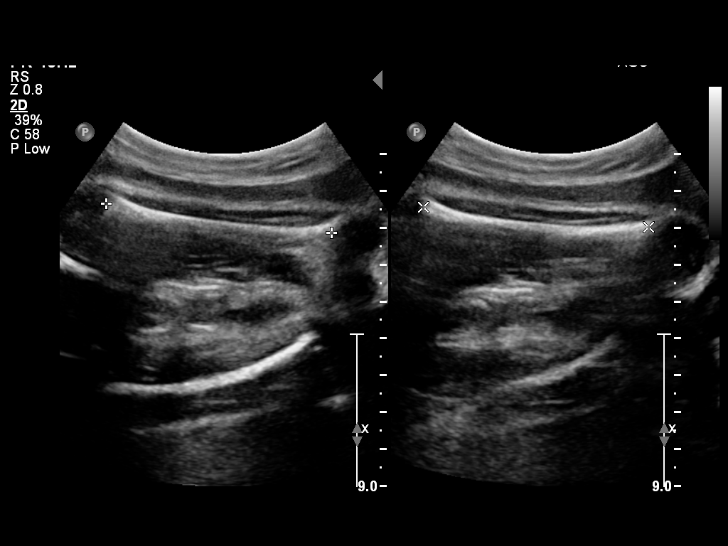
[im 44/80]
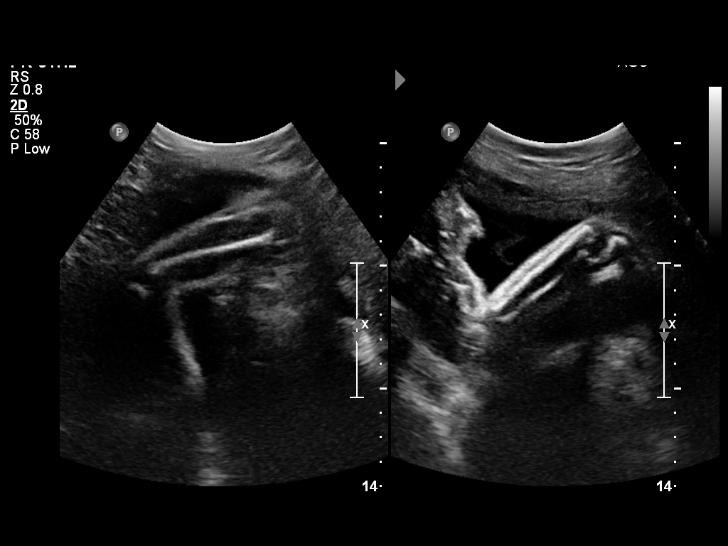
[im 50/80]
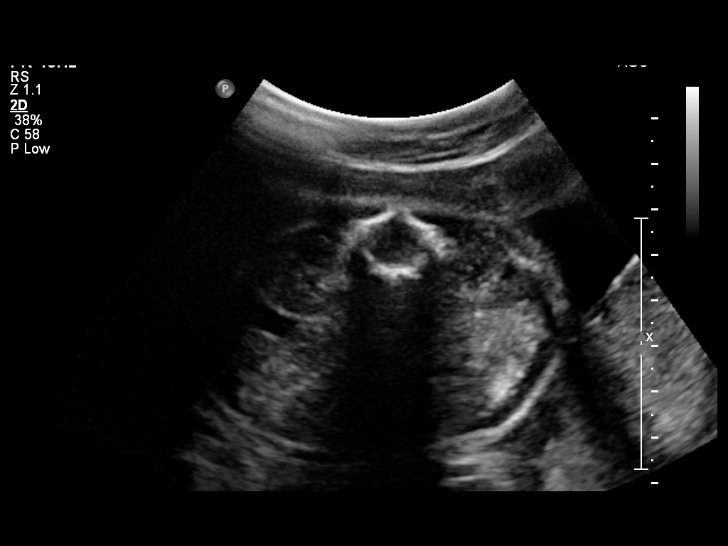
[im 56/80]
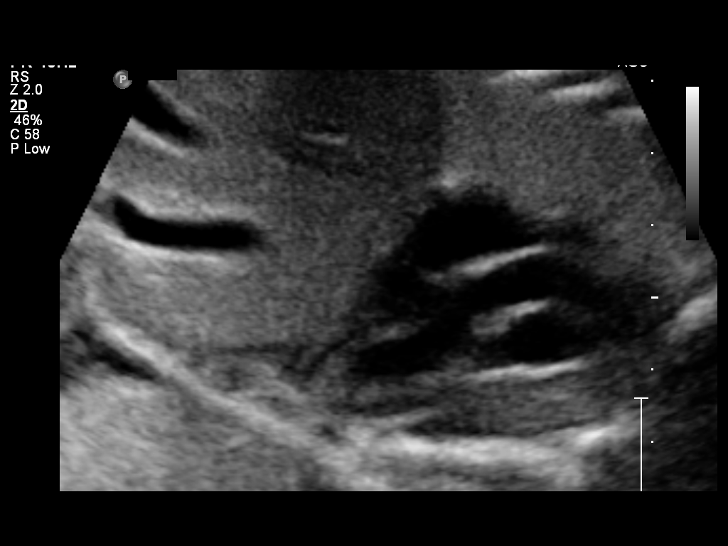
[im 65/80]
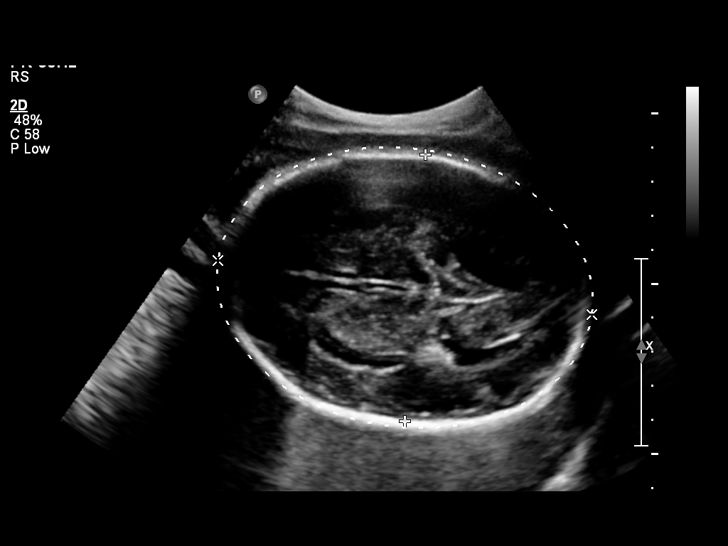
[im 71/80]
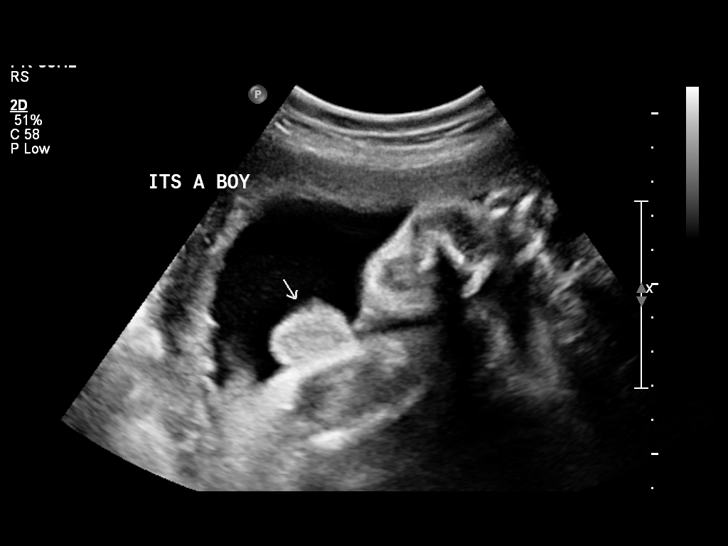
[im 77/80]
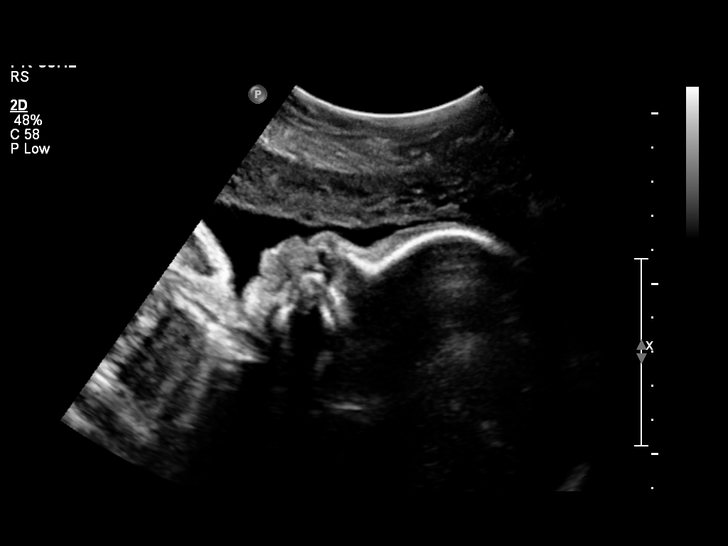

[12 of 28 positions shown; findings below may reference images not displayed]

FINDINGS: 1. Single intrauterine pregnancy.
 2. Estimated fetal weight is in the 59th%.
 3. Posterior placenta without evidence of previa.
 4. Normal amniotic fluid index.
 5. The views of the spine, heart, ankels, arms, and hands are
 limited.
 6. The remainder of the limited anatomy survey is normal.
Recommendations

 1. Appropriate fetal growth.
 2. Limited anatomy survey:
 - recommend follow up ultrasound in 3-4 weeks to complete
 anatomic survey
 3. Advanced maternal age:
 - per chart had normal aneuploidy screening- I do not have
 these results

 questions or concerns.

## 2016-05-08 IMAGING — US US OB FOLLOW-UP
1 series · 12 of 28 positions shown · non-contrast
Comparison: none

[Series 1: us ob follow up · 57 acquisitions, 12 frames shown]
[im 3/57]
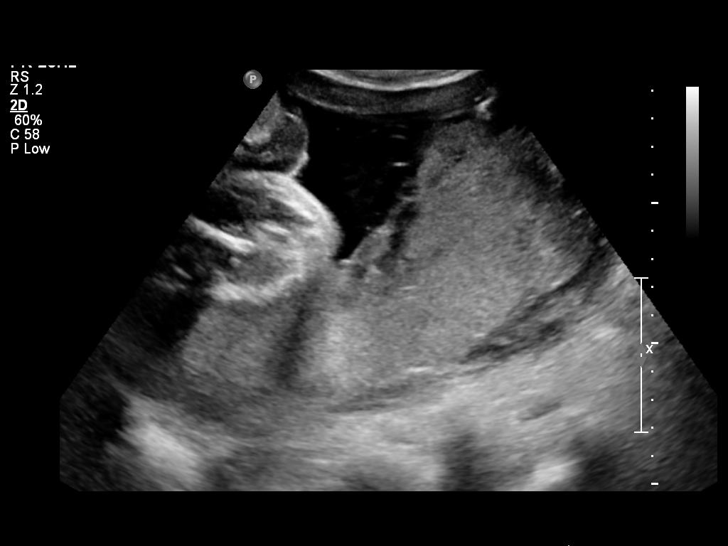
[im 7/57]
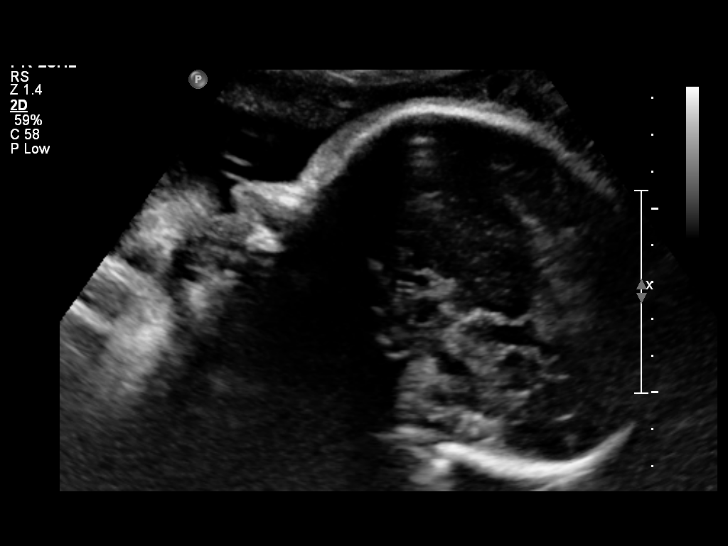
[im 11/57]
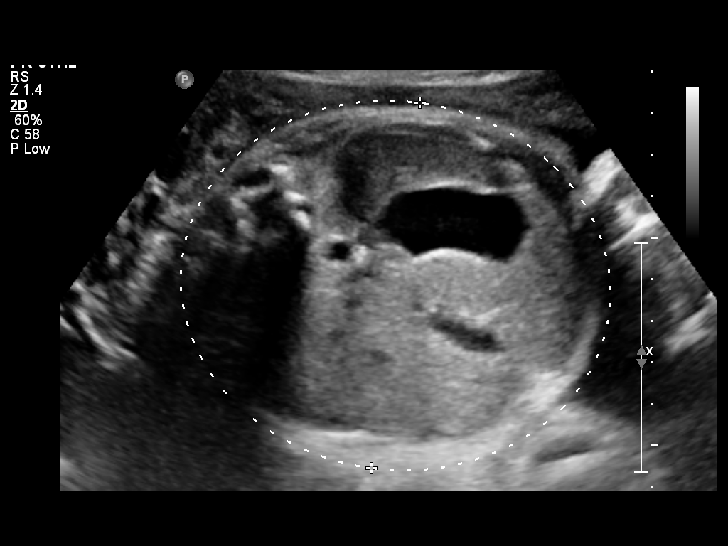
[im 17/57]
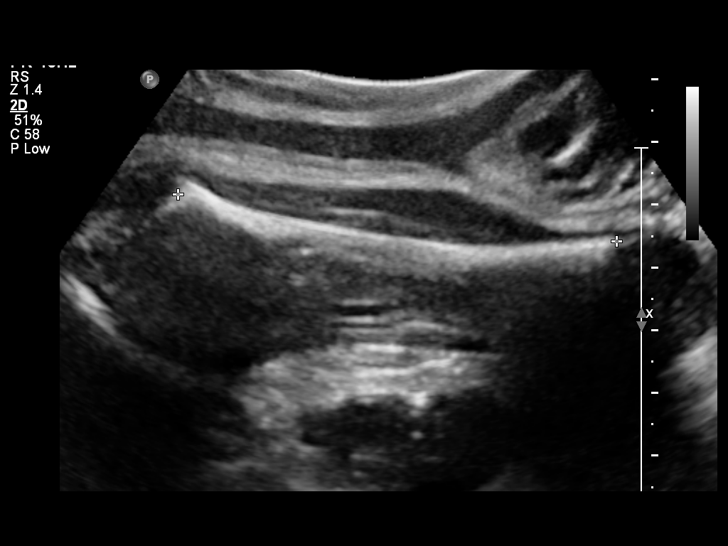
[im 21/57]
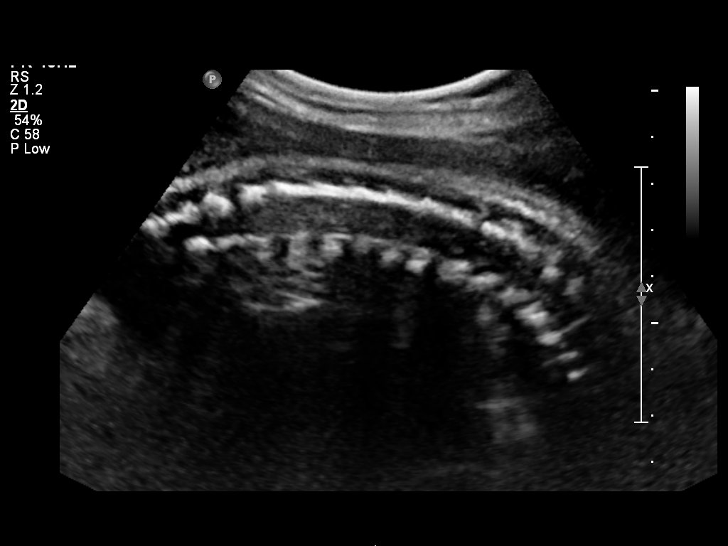
[im 25/57]
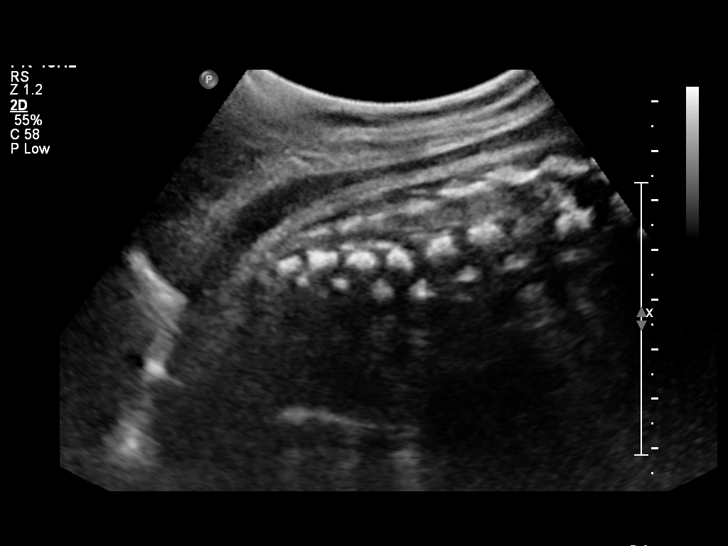
[im 32/57]
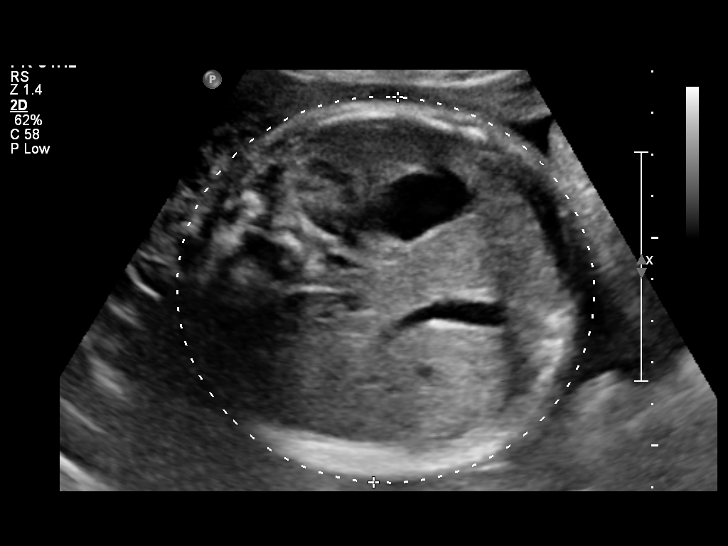
[im 36/57]
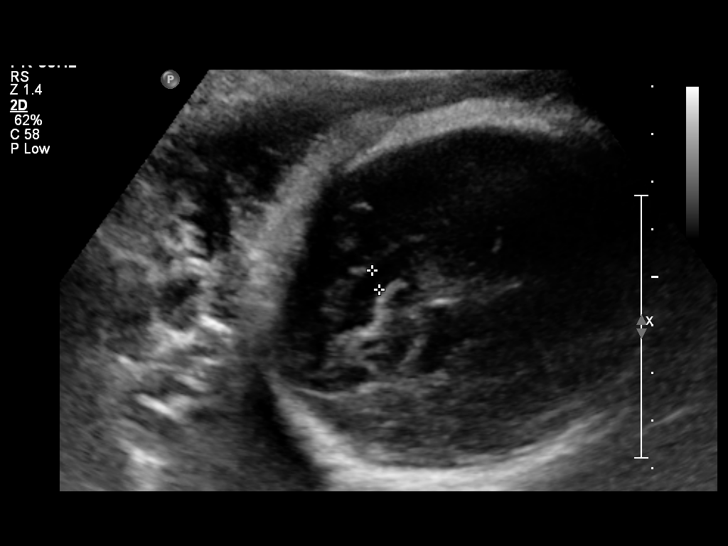
[im 40/57]
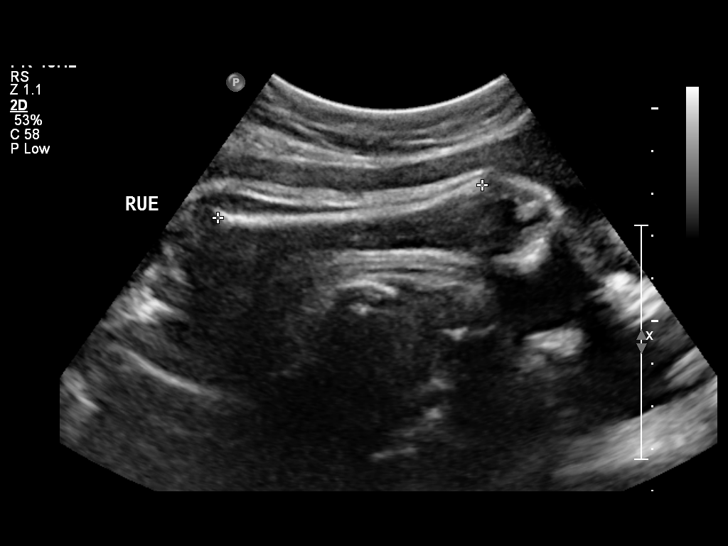
[im 46/57]
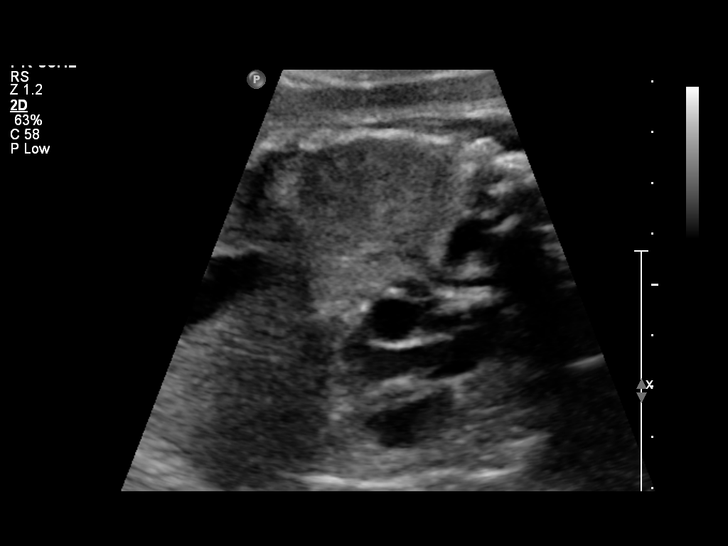
[im 50/57]
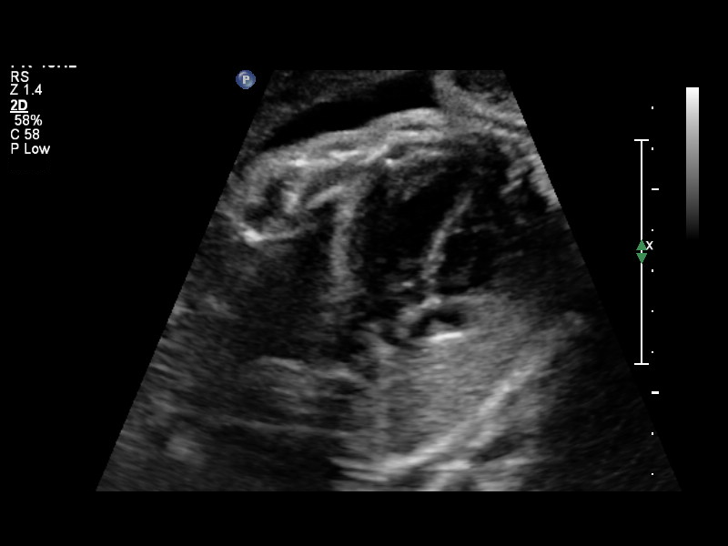
[im 54/57]
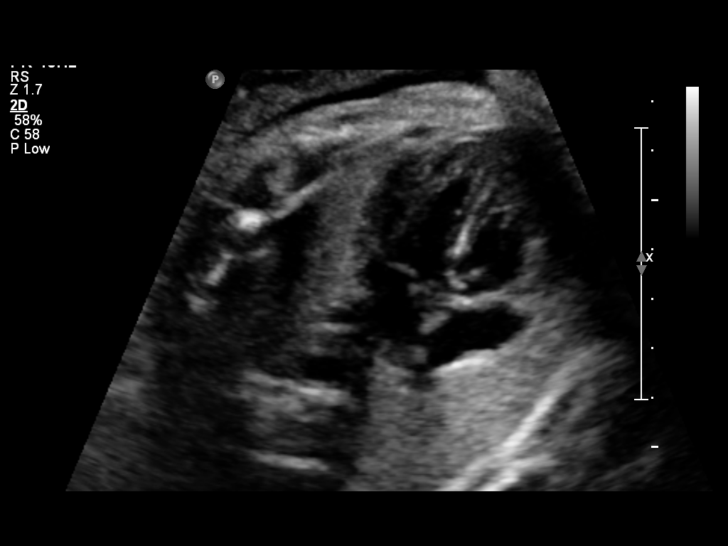

[12 of 28 positions shown; findings below may reference images not displayed]

OBSTETRICS REPORT
                      (Signed Final 09/13/2013 [DATE])

Service(s) Provided

 US OB FOLLOW UP                                       76816.1
Indications

 Advanced maternal age (AMA), Multigravida
 Follow-up incomplete fetal anatomic evaluation

Fetal Evaluation

 Num Of Fetuses:    1
 Fetal Heart Rate:  142                          bpm
 Cardiac Activity:  Observed
 Presentation:      Cephalic
 Placenta:          Posterior, above cervical
                    os

 Amniotic Fluid
 AFI FV:      Subjectively within normal limits
 AFI Sum:     9.21    cm       14  %Tile     Larg Pckt:    3.11  cm
 RUQ:   2.31    cm   RLQ:    1.74   cm    LUQ:   2.05    cm   LLQ:    3.11   cm
Biometry

 BPD:       85  mm     G. Age:  34w 1d                CI:        71.92   70 - 86
                                                      FL/HC:      21.9   20.1 -

 HC:       319  mm     G. Age:  35w 6d       46  %    HC/AC:      1.06   0.93 -

 AC:     300.4  mm     G. Age:  34w 0d       36  %    FL/BPD:     82.1   71 - 87
 FL:      69.8  mm     G. Age:  35w 6d       71  %    FL/AC:      23.2   20 - 24
 HUM:       62  mm     G. Age:  35w 6d       87  %

 Est. FW:    7577  gm      5 lb 8 oz     61  %
Gestational Age

 LMP:           32w 6d        Date:  01/26/13                 EDD:   11/02/13
 U/S Today:     35w 0d                                        EDD:   10/18/13
 Best:          34w 5d     Det. By:  Early Ultrasound         EDD:   10/20/13
Anatomy

 Cranium:          Appears normal         Aortic Arch:      Appears normal
 Fetal Cavum:      Appears normal         Ductal Arch:      Not well visualized
 Ventricles:       Appears normal         Diaphragm:        Appears normal
 Choroid Plexus:   Appears normal         Stomach:          Appears normal
 Cerebellum:       Previously seen        Abdomen:          Appears normal
 Posterior Fossa:  Previously seen        Abdominal Wall:   Previously seen
 Nuchal Fold:      Not applicable (>20    Cord Vessels:     Appears normal (3
                   wks GA)                                  vessel cord)
 Face:             Appears normal         Kidneys:          Appear normal
                   (orbits and profile)
 Lips:             Previously seen        Bladder:          Appears normal
 Heart:            Appears normal         Spine:            Appears normal
                   (4CH, axis, and
                   situs)
 RVOT:             Appears normal         Lower             Previously seen
                                          Extremities:
 LVOT:             Appears normal         Upper             Appears normal
                                          Extremities:

 Other:  Fetus gender previously determined male. . Complete fetal anatomic
         survey previously performed at another facility.
Cervix Uterus Adnexa

 Cervix:       Not visualized (advanced GA >01wks)

 Left Ovary:    Within normal limits.
 Right Ovary:   Within normal limits.
Comments

 The patient's fetal anatomic survey is now complete.  No fetal
 anomalies or soft markers of aneuploidy were seen.
Impression

 Single living intrauterine pregnancy at 35 weeks 0 days.
 Appropriate fetal growth (61%).
 Normal amniotic fluid volume.
 Normal fetal anatomy.
 No fetal anomalies or soft markers of aneuploidy seen.
Recommendations

 Follow-up ultrasounds as clinically indicated.

 questions or concerns.
                Lecky, Javoi
